# Patient Record
Sex: Female | Born: 1955 | Race: White | Hispanic: No | Smoking: Never smoker
Health system: Southern US, Community
[De-identification: ages and names within clinical notes are randomized; demographics above are authoritative.]

## PROBLEM LIST (undated history)

## (undated) HISTORY — PX: WRIST SURGERY: SHX841

---

## 2007-08-27 ENCOUNTER — Ambulatory Visit: Payer: Self-pay | Admitting: Unknown Physician Specialty

## 2007-08-28 ENCOUNTER — Ambulatory Visit: Payer: Self-pay | Admitting: Unknown Physician Specialty

## 2008-05-18 ENCOUNTER — Ambulatory Visit: Payer: Self-pay | Admitting: Family Medicine

## 2010-07-03 ENCOUNTER — Ambulatory Visit: Payer: Self-pay

## 2012-08-13 ENCOUNTER — Ambulatory Visit: Payer: Self-pay | Admitting: Family Medicine

## 2017-04-05 ENCOUNTER — Emergency Department: Payer: No Typology Code available for payment source

## 2017-04-05 ENCOUNTER — Emergency Department
Admission: EM | Admit: 2017-04-05 | Discharge: 2017-04-05 | Disposition: A | Discharge status: Home / Self Care | Payer: No Typology Code available for payment source | Attending: Emergency Medicine | Admitting: Emergency Medicine

## 2017-04-05 ENCOUNTER — Encounter: Payer: Self-pay | Admitting: Emergency Medicine

## 2017-04-05 DIAGNOSIS — Y9389 Activity, other specified: Secondary | ICD-10-CM | POA: Diagnosis not present

## 2017-04-05 DIAGNOSIS — S52571A Other intraarticular fracture of lower end of right radius, initial encounter for closed fracture: Secondary | ICD-10-CM | POA: Insufficient documentation

## 2017-04-05 DIAGNOSIS — Y999 Unspecified external cause status: Secondary | ICD-10-CM | POA: Diagnosis not present

## 2017-04-05 DIAGNOSIS — S52614A Nondisplaced fracture of right ulna styloid process, initial encounter for closed fracture: Secondary | ICD-10-CM | POA: Insufficient documentation

## 2017-04-05 DIAGNOSIS — Y92410 Unspecified street and highway as the place of occurrence of the external cause: Secondary | ICD-10-CM | POA: Insufficient documentation

## 2017-04-05 DIAGNOSIS — S6991XA Unspecified injury of right wrist, hand and finger(s), initial encounter: Secondary | ICD-10-CM | POA: Diagnosis present

## 2017-04-05 DIAGNOSIS — S62101A Fracture of unspecified carpal bone, right wrist, initial encounter for closed fracture: Secondary | ICD-10-CM

## 2017-04-05 MED ORDER — OXYCODONE-ACETAMINOPHEN 7.5-325 MG PO TABS: 1.0000 | ORAL_TABLET | ORAL | 0 refills | Status: DC | PRN

## 2017-04-05 MED ORDER — IBUPROFEN 600 MG PO TABS
600.0000 mg | ORAL_TABLET | Freq: Once | ORAL | Status: AC
  Administered 2017-04-05: 600 mg via ORAL
  Filled 2017-04-05: qty 1

## 2017-04-05 MED ORDER — OXYCODONE-ACETAMINOPHEN 5-325 MG PO TABS
1.0000 | ORAL_TABLET | Freq: Once | ORAL | Status: AC
  Administered 2017-04-05: 1 via ORAL
  Filled 2017-04-05: qty 1
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
## 2017-04-05 NOTE — Discharge Instructions (Signed)
Wear splint/sling until evaluation and treatment by orthopedic clinic. Did not have breakfast on the morning of the department.
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
## 2017-04-05 NOTE — ED Notes (Signed)
PA waiting to hear back from Ortho; delay explained to pt at this time.

## 2017-04-08 ENCOUNTER — Ambulatory Visit: Payer: No Typology Code available for payment source | Admitting: Anesthesiology

## 2017-04-08 ENCOUNTER — Ambulatory Visit
Admission: RE | Admit: 2017-04-08 | Discharge: 2017-04-08 | Disposition: A | Discharge status: Home / Self Care | Payer: No Typology Code available for payment source | Source: Ambulatory Visit | Attending: Orthopedic Surgery | Admitting: Orthopedic Surgery

## 2017-04-08 ENCOUNTER — Encounter: Payer: Self-pay | Admitting: *Deleted

## 2017-04-08 ENCOUNTER — Ambulatory Visit: Payer: No Typology Code available for payment source

## 2017-04-08 ENCOUNTER — Encounter
Admission: RE | Disposition: A | Discharge status: Home / Self Care | Payer: Self-pay | Source: Ambulatory Visit | Attending: Orthopedic Surgery

## 2017-04-08 DIAGNOSIS — Y92410 Unspecified street and highway as the place of occurrence of the external cause: Secondary | ICD-10-CM | POA: Diagnosis not present

## 2017-04-08 DIAGNOSIS — S52571A Other intraarticular fracture of lower end of right radius, initial encounter for closed fracture: Secondary | ICD-10-CM | POA: Diagnosis present

## 2017-04-08 DIAGNOSIS — Y939 Activity, unspecified: Secondary | ICD-10-CM | POA: Insufficient documentation

## 2017-04-08 DIAGNOSIS — Z9889 Other specified postprocedural states: Secondary | ICD-10-CM

## 2017-04-08 DIAGNOSIS — Z8781 Personal history of (healed) traumatic fracture: Secondary | ICD-10-CM

## 2017-04-08 HISTORY — PX: OPEN REDUCTION INTERNAL FIXATION (ORIF) DISTAL RADIAL FRACTURE: SHX5989

## 2017-04-08 SURGERY — OPEN REDUCTION INTERNAL FIXATION (ORIF) DISTAL RADIUS FRACTURE
Anesthesia: General | Site: Wrist | Laterality: Right | Wound class: Clean

## 2017-04-08 MED ORDER — ACETAMINOPHEN 10 MG/ML IV SOLN
INTRAVENOUS | Status: AC
  Filled 2017-04-08: qty 100

## 2017-04-08 MED ORDER — CEFAZOLIN SODIUM-DEXTROSE 2-4 GM/100ML-% IV SOLN
INTRAVENOUS | Status: AC
  Filled 2017-04-08: qty 100

## 2017-04-08 MED ORDER — LACTATED RINGERS IV SOLN
INTRAVENOUS | Status: DC
  Administered 2017-04-08 (×2): via INTRAVENOUS

## 2017-04-08 MED ORDER — NEOMYCIN-POLYMYXIN B GU 40-200000 IR SOLN
Status: DC | PRN
  Administered 2017-04-08: 2 mL

## 2017-04-08 MED ORDER — PROMETHAZINE HCL 25 MG/ML IJ SOLN: 6.2500 mg | INTRAMUSCULAR | Status: DC | PRN

## 2017-04-08 MED ORDER — SUGAMMADEX SODIUM 200 MG/2ML IV SOLN
INTRAVENOUS | Status: DC | PRN
  Administered 2017-04-08: 150 mg via INTRAVENOUS

## 2017-04-08 MED ORDER — ROCURONIUM BROMIDE 100 MG/10ML IV SOLN
INTRAVENOUS | Status: DC | PRN
Start: 2017-04-08 — End: 2017-04-08
  Administered 2017-04-08: 50 mg via INTRAVENOUS

## 2017-04-08 MED ORDER — FENTANYL CITRATE (PF) 250 MCG/5ML IJ SOLN
INTRAMUSCULAR | Status: AC
  Filled 2017-04-08: qty 5

## 2017-04-08 MED ORDER — ONDANSETRON HCL 4 MG/2ML IJ SOLN
INTRAMUSCULAR | Status: DC | PRN
Start: 2017-04-08 — End: 2017-04-08
  Administered 2017-04-08: 4 mg via INTRAVENOUS

## 2017-04-08 MED ORDER — ROCURONIUM BROMIDE 50 MG/5ML IV SOLN
INTRAVENOUS | Status: AC
  Filled 2017-04-08: qty 1

## 2017-04-08 MED ORDER — CEFAZOLIN SODIUM-DEXTROSE 2-4 GM/100ML-% IV SOLN
2.0000 g | Freq: Once | INTRAVENOUS | Status: AC
  Administered 2017-04-08: 2 g via INTRAVENOUS

## 2017-04-08 MED ORDER — LIDOCAINE HCL (CARDIAC) 20 MG/ML IV SOLN
INTRAVENOUS | Status: DC | PRN
  Administered 2017-04-08: 40 mg via INTRAVENOUS

## 2017-04-08 MED ORDER — OXYCODONE HCL 5 MG PO TABS: 5.0000 mg | ORAL_TABLET | Freq: Once | ORAL | Status: DC | PRN

## 2017-04-08 MED ORDER — ACETAMINOPHEN 10 MG/ML IV SOLN
INTRAVENOUS | Status: DC | PRN
  Administered 2017-04-08: 1000 mg via INTRAVENOUS

## 2017-04-08 MED ORDER — FENTANYL CITRATE (PF) 100 MCG/2ML IJ SOLN
INTRAMUSCULAR | Status: AC
  Filled 2017-04-08: qty 2

## 2017-04-08 MED ORDER — FENTANYL CITRATE (PF) 100 MCG/2ML IJ SOLN
INTRAMUSCULAR | Status: DC | PRN
  Administered 2017-04-08: 100 ug via INTRAVENOUS
  Administered 2017-04-08: 50 ug via INTRAVENOUS
  Administered 2017-04-08: 100 ug via INTRAVENOUS

## 2017-04-08 MED ORDER — PROPOFOL 10 MG/ML IV BOLUS
INTRAVENOUS | Status: AC
  Filled 2017-04-08: qty 20

## 2017-04-08 MED ORDER — MIDAZOLAM HCL 2 MG/2ML IJ SOLN
INTRAMUSCULAR | Status: DC | PRN
  Administered 2017-04-08: 2 mg via INTRAVENOUS

## 2017-04-08 MED ORDER — OXYCODONE HCL 5 MG/5ML PO SOLN: 5.0000 mg | Freq: Once | ORAL | Status: DC | PRN

## 2017-04-08 MED ORDER — DEXAMETHASONE SODIUM PHOSPHATE 10 MG/ML IJ SOLN
INTRAMUSCULAR | Status: DC | PRN
  Administered 2017-04-08: 8 mg via INTRAVENOUS

## 2017-04-08 MED ORDER — MEPERIDINE HCL 50 MG/ML IJ SOLN
6.2500 mg | INTRAMUSCULAR | Status: DC | PRN
Start: 2017-04-08 — End: 2017-04-08

## 2017-04-08 MED ORDER — FENTANYL CITRATE (PF) 100 MCG/2ML IJ SOLN
25.0000 ug | INTRAMUSCULAR | Status: AC | PRN
  Administered 2017-04-08 (×6): 25 ug via INTRAVENOUS

## 2017-04-08 MED ORDER — HYDROCODONE-ACETAMINOPHEN 5-325 MG PO TABS: 1.0000 | ORAL_TABLET | Freq: Four times a day (QID) | ORAL | 0 refills | Status: AC | PRN

## 2017-04-08 MED ORDER — PROPOFOL 10 MG/ML IV BOLUS
INTRAVENOUS | Status: DC | PRN
  Administered 2017-04-08: 130 mg via INTRAVENOUS

## 2017-04-08 MED ORDER — LIDOCAINE HCL (PF) 2 % IJ SOLN
INTRAMUSCULAR | Status: AC
  Filled 2017-04-08: qty 2

## 2017-04-08 MED ORDER — MIDAZOLAM HCL 2 MG/2ML IJ SOLN
INTRAMUSCULAR | Status: AC
  Filled 2017-04-08: qty 2

## 2017-04-08 SURGICAL SUPPLY — 38 items
BANDAGE ACE 4X5 VEL STRL LF (GAUZE/BANDAGES/DRESSINGS) ×3 IMPLANT
BIT DRILL 2 FAST STEP (BIT) ×3 IMPLANT
BIT DRILL 2.5X4 QC (BIT) ×3 IMPLANT
CANISTER SUCT 1200ML W/VALVE (MISCELLANEOUS) ×3 IMPLANT
CHLORAPREP W/TINT 26ML (MISCELLANEOUS) ×3 IMPLANT
CUFF TOURN 18 STER (MISCELLANEOUS) ×3 IMPLANT
DRAPE FLUOR MINI C-ARM 54X84 (DRAPES) ×3 IMPLANT
ELECT REM PT RETURN 9FT ADLT (ELECTROSURGICAL) ×3
ELECTRODE REM PT RTRN 9FT ADLT (ELECTROSURGICAL) ×1 IMPLANT
GAUZE PETRO XEROFOAM 1X8 (MISCELLANEOUS) ×6 IMPLANT
GAUZE SPONGE 4X4 12PLY STRL (GAUZE/BANDAGES/DRESSINGS) ×3 IMPLANT
GAUZE XEROFORM 4X4 STRL (GAUZE/BANDAGES/DRESSINGS) ×3 IMPLANT
GLOVE SURG SYN 9.0  PF PI (GLOVE) ×2
GLOVE SURG SYN 9.0 PF PI (GLOVE) ×1 IMPLANT
GOWN SRG 2XL LVL 4 RGLN SLV (GOWNS) ×1 IMPLANT
GOWN STRL NON-REIN 2XL LVL4 (GOWNS) ×2
GOWN STRL REUS W/ TWL LRG LVL3 (GOWN DISPOSABLE) ×1 IMPLANT
GOWN STRL REUS W/TWL LRG LVL3 (GOWN DISPOSABLE) ×2
K-WIRE 1.6 (WIRE) ×2
K-WIRE FX5X1.6XNS BN SS (WIRE) ×1
KIT RM TURNOVER STRD PROC AR (KITS) ×3 IMPLANT
KWIRE FX5X1.6XNS BN SS (WIRE) ×1 IMPLANT
NEEDLE FILTER BLUNT 18X 1/2SAF (NEEDLE) ×2
NEEDLE FILTER BLUNT 18X1 1/2 (NEEDLE) ×1 IMPLANT
NS IRRIG 500ML POUR BTL (IV SOLUTION) ×3 IMPLANT
PACK EXTREMITY ARMC (MISCELLANEOUS) ×3 IMPLANT
PAD CAST CTTN 4X4 STRL (SOFTGOODS) ×1 IMPLANT
PADDING CAST COTTON 4X4 STRL (SOFTGOODS) ×2
PEG SUBCHONDRAL SMOOTH 2.0X14 (Peg) ×9 IMPLANT
PEG SUBCHONDRAL SMOOTH 2.0X16 (Peg) ×6 IMPLANT
PLATE SHORT 21.6X48.9 NRRW RT (Plate) ×3 IMPLANT
SCREW CORT 3.5X10 LNG (Screw) ×9 IMPLANT
SPLINT CAST 1 STEP 3X12 (MISCELLANEOUS) ×3 IMPLANT
SUT ETHILON 4-0 (SUTURE) ×2
SUT ETHILON 4-0 FS2 18XMFL BLK (SUTURE) ×1
SUT VICRYL 3-0 27IN (SUTURE) ×3 IMPLANT
SUTURE ETHLN 4-0 FS2 18XMF BLK (SUTURE) ×1 IMPLANT
SYR 3ML LL SCALE MARK (SYRINGE) ×3 IMPLANT

## 2017-04-08 NOTE — Discharge Instructions (Addendum)
AMBULATORY SURGERY  DISCHARGE INSTRUCTIONS   1) The drugs that you were given will stay in your system until tomorrow so for the next 24 hours you should not:  A) Drive an automobile B) Make any legal decisions C) Drink any alcoholic beverage   2) You may resume regular meals tomorrow.  Today it is better to start with liquids and gradually work up to solid foods.  You may eat anything you prefer, but it is better to start with liquids, then soup and crackers, and gradually work up to solid foods.   3) Please notify your doctor immediately if you have any unusual bleeding, trouble breathing, redness and pain at the surgery site, drainage, fever, or pain not relieved by medication.    4) Additional Instructions:        Please contact your physician with any problems or Same Day Surgery at [phone], Monday through Friday 6 am to 4 pm, or [place] at [facility] Main number at [phone].Keep hand elevated. Work fingers is much as possible. Loosen Ace wrap if fingers swell. Pain medicine as directed

## 2017-04-08 NOTE — Anesthesia Procedure Notes (Signed)
Procedure Name: Intubation Date/Time: 04/08/2017 3:23 PM Performed by: Allean Found Pre-anesthesia Checklist: Patient identified, Emergency Drugs available, Suction available, Patient being monitored and Timeout performed Patient Re-evaluated:Patient Re-evaluated prior to inductionOxygen Delivery Method: Circle system utilized Preoxygenation: Pre-oxygenation with 100% oxygen Intubation Type: IV induction Ventilation: Mask ventilation without difficulty Laryngoscope Size: Mac and 3 Grade View: Grade I Tube type: Oral Tube size: 7.0 mm Number of attempts: 1 Airway Equipment and Method: Stylet Placement Confirmation: ETT inserted through vocal cords under direct vision,  positive ETCO2 and breath sounds checked- equal and bilateral Secured at: 21 cm Tube secured with: Tape Dental Injury: Teeth and Oropharynx as per pre-operative assessment

## 2017-04-08 NOTE — H&P (Signed)
Reviewed paper H+P, will be scanned into chart. No changes noted.  

## 2017-04-08 NOTE — Anesthesia Post-op Follow-up Note (Cosign Needed)
Anesthesia QCDR form completed.        
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
## 2017-04-08 NOTE — Transfer of Care (Signed)
Immediate Anesthesia Transfer of Care Note  Patient: Veronica Austin  Procedure(s) Performed: Procedure(s): OPEN REDUCTION INTERNAL FIXATION (ORIF) DISTAL RADIAL FRACTURE (Right)  Patient Location: PACU  Anesthesia Type:General  Level of Consciousness: awake, alert  and oriented  Airway & Oxygen Therapy: Patient Spontanous Breathing and Patient connected to face mask oxygen  Post-op Assessment: Report given to RN and Post -op Vital signs reviewed and stable  Post vital signs: Reviewed and stable  Last Vitals:  Vitals:   04/08/17 1342  BP: 133/77  Pulse: 78  Resp: 18  Temp: 37.1 C    Last Pain:  Vitals:   04/08/17 1342  TempSrc: Oral  PainSc: 6          Complications: No apparent anesthesia complications
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
## 2017-04-08 NOTE — Op Note (Signed)
04/08/2017  4:09 PM  PATIENT:  Veronica Austin  61 y.o. female  PRE-OPERATIVE DIAGNOSIS:  fracture right distal radius comminuted intra-articular  POST-OPERATIVE DIAGNOSIS:  fracture right distal radius comminuted intra-articular  PROCEDURE:  Procedure(s): OPEN REDUCTION INTERNAL FIXATION (ORIF) DISTAL RADIAL FRACTURE (Right)  SURGEON: Leitha SchullerMichael J Jakaylah Schlafer, MD  ASSISTANTS: None  ANESTHESIA:   general  EBL:  Total I/O In: 300 [I.V.:300] Out: 10 [Blood:10]  BLOOD ADMINISTERED:none  DRAINS: none   LOCAL MEDICATIONS USED:  NONE  SPECIMEN:  No Specimen  DISPOSITION OF SPECIMEN:  N/A  COUNTS:  YES  TOURNIQUET:   Total Tourniquet Time Documented: Upper Arm (Right) - 17 minutes Total: Upper Arm (Right) - 17 minutes   IMPLANTS: [employer] short narrow DVR plate with multiple pegs and screws  DICTATION: .Dragon Dictation patient was brought to the operating room and after adequate general anesthesia was obtained, the right arm was prepped and draped in sterile fashion with a tourniquet applied to the upper arm. After patient identification and timeout procedures were completed, tourniquet was raised. A volar approach is made centered over the FCR tendon. The tendon sheath was incised and the tendon retracted radially. The deep fascia was incised and the substantial fascia and muscle was retracted radially. The quadratus had been torn from the bone and was elevated easily. With fingertrap traction applied and 10 pounds of traction off the end of the table length of been restored with direct pressure anatomic alignment was obtained. A volar plate was then placed and a pin placed with a K wire driver to hold it in position and when it was the appropriate position 3 cortical screws were placed proximally followed by smooth pegs drilling measuring and placing the smooth pegs there was no intra-articular penetration and stable fixation. After tourniquet was let down under fluoroscopy  without the traction applied anatomic reduction was stable the wound was irrigated and closed with 2 3-0 Vicryl substantially and 4-0 nylon Xeroform 4 x 4 web roll volar splint and Ace wrap applied  PLAN OF CARE: Discharge to home after PACU  PATIENT DISPOSITION:  PACU - hemodynamically stable.

## 2017-04-09 ENCOUNTER — Encounter: Payer: Self-pay | Admitting: Orthopedic Surgery

## 2017-04-10 NOTE — Anesthesia Postprocedure Evaluation (Signed)
Anesthesia Post Note  Patient: Athanasia A Jezek  Procedure(s) Performed: Procedure(s) (LRB): OPEN REDUCTION INTERNAL FIXATION (ORIF) DISTAL RADIAL FRACTURE (Right)  Patient location during evaluation: PACU Anesthesia Type: General Level of consciousness: awake and alert and oriented Pain management: pain level controlled Vital Signs Assessment: post-procedure vital signs reviewed and stable Respiratory status: spontaneous breathing, nonlabored ventilation and respiratory function stable Cardiovascular status: blood pressure returned to baseline and stable Postop Assessment: no signs of nausea or vomiting Anesthetic complications: no     Last Vitals:  Vitals:   04/08/17 1700 04/08/17 1747  BP: (!) 154/80 136/72  Pulse: 76 86  Resp: 16 16  Temp: 36.7 C     Last Pain:  Vitals:   04/09/17 0916  TempSrc:   PainSc: 0-No pain                 Kenzie Thoreson
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
## 2018-05-31 IMAGING — DX DG WRIST 2V*R*
2 series · 2 of 2 positions shown · non-contrast
Comparison: 04/05/2017

CLINICAL DATA: Open-reduction internal-fixation of the right
radius.

EXAM:
RIGHT WRIST - 2 VIEW

[wrist ap]
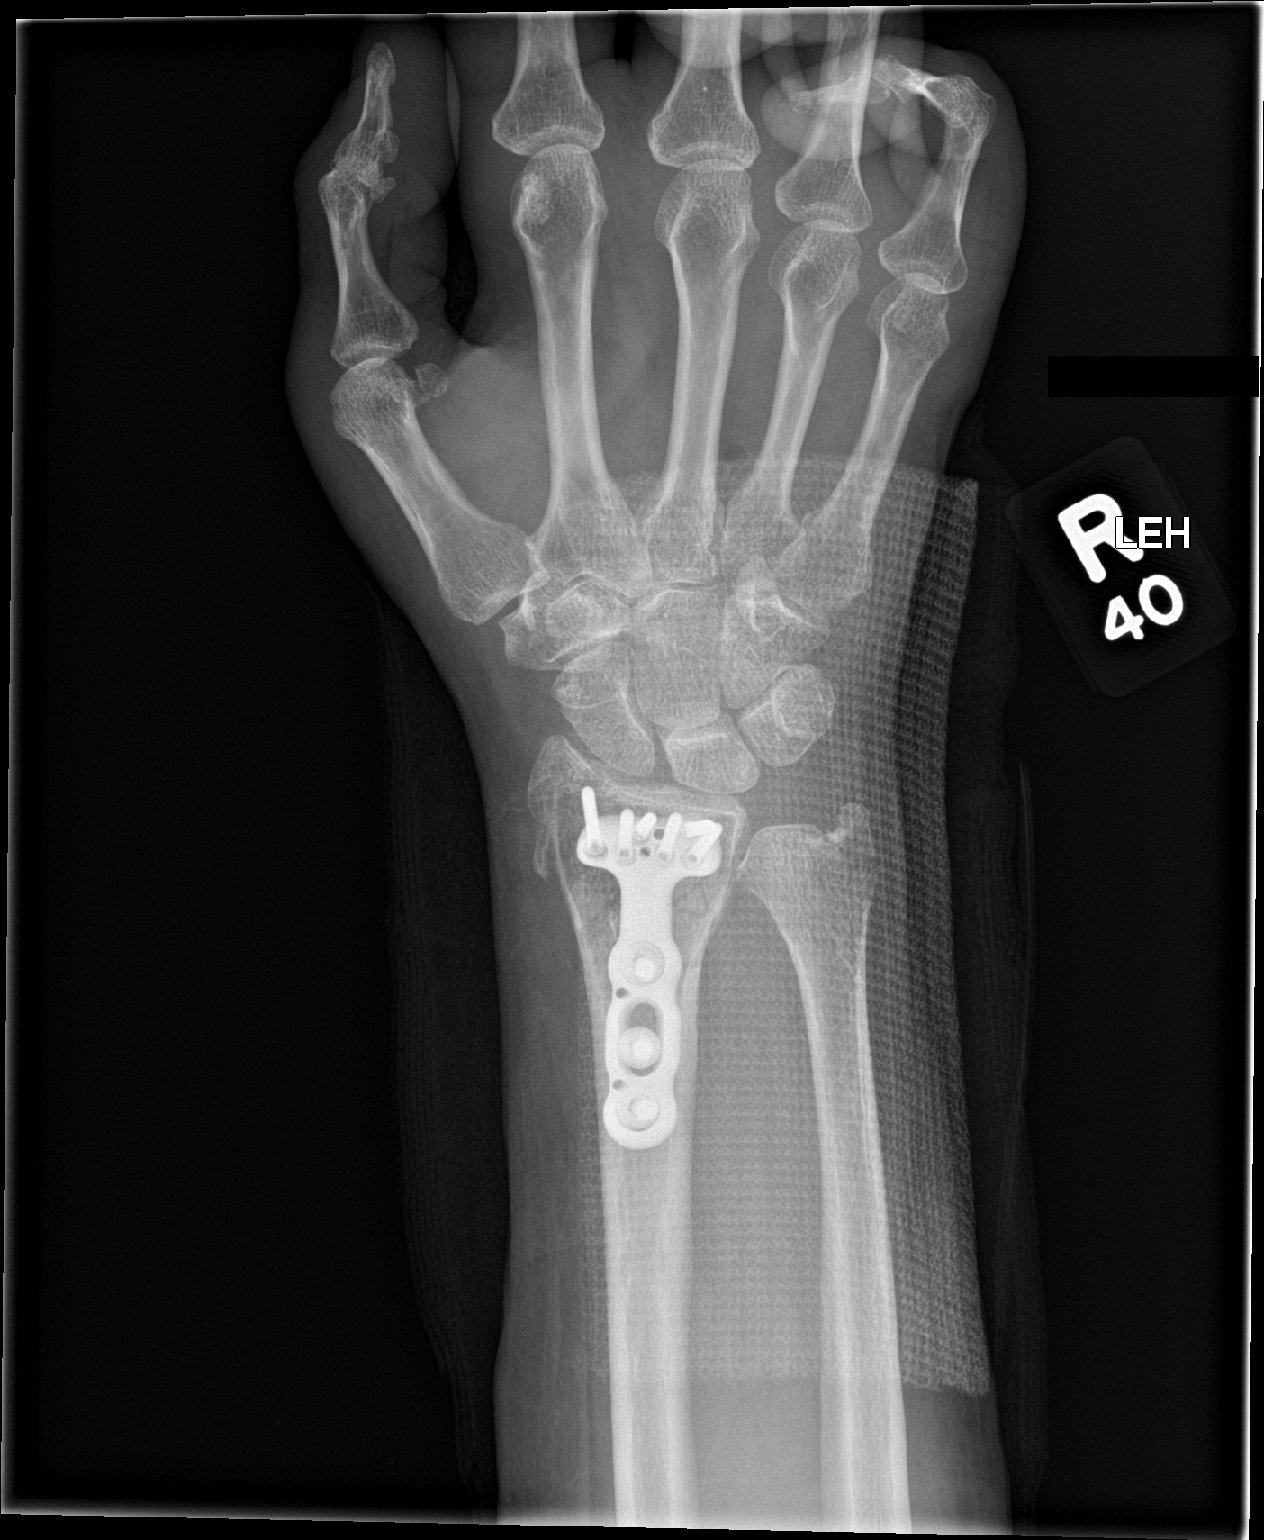

[wrist lat]
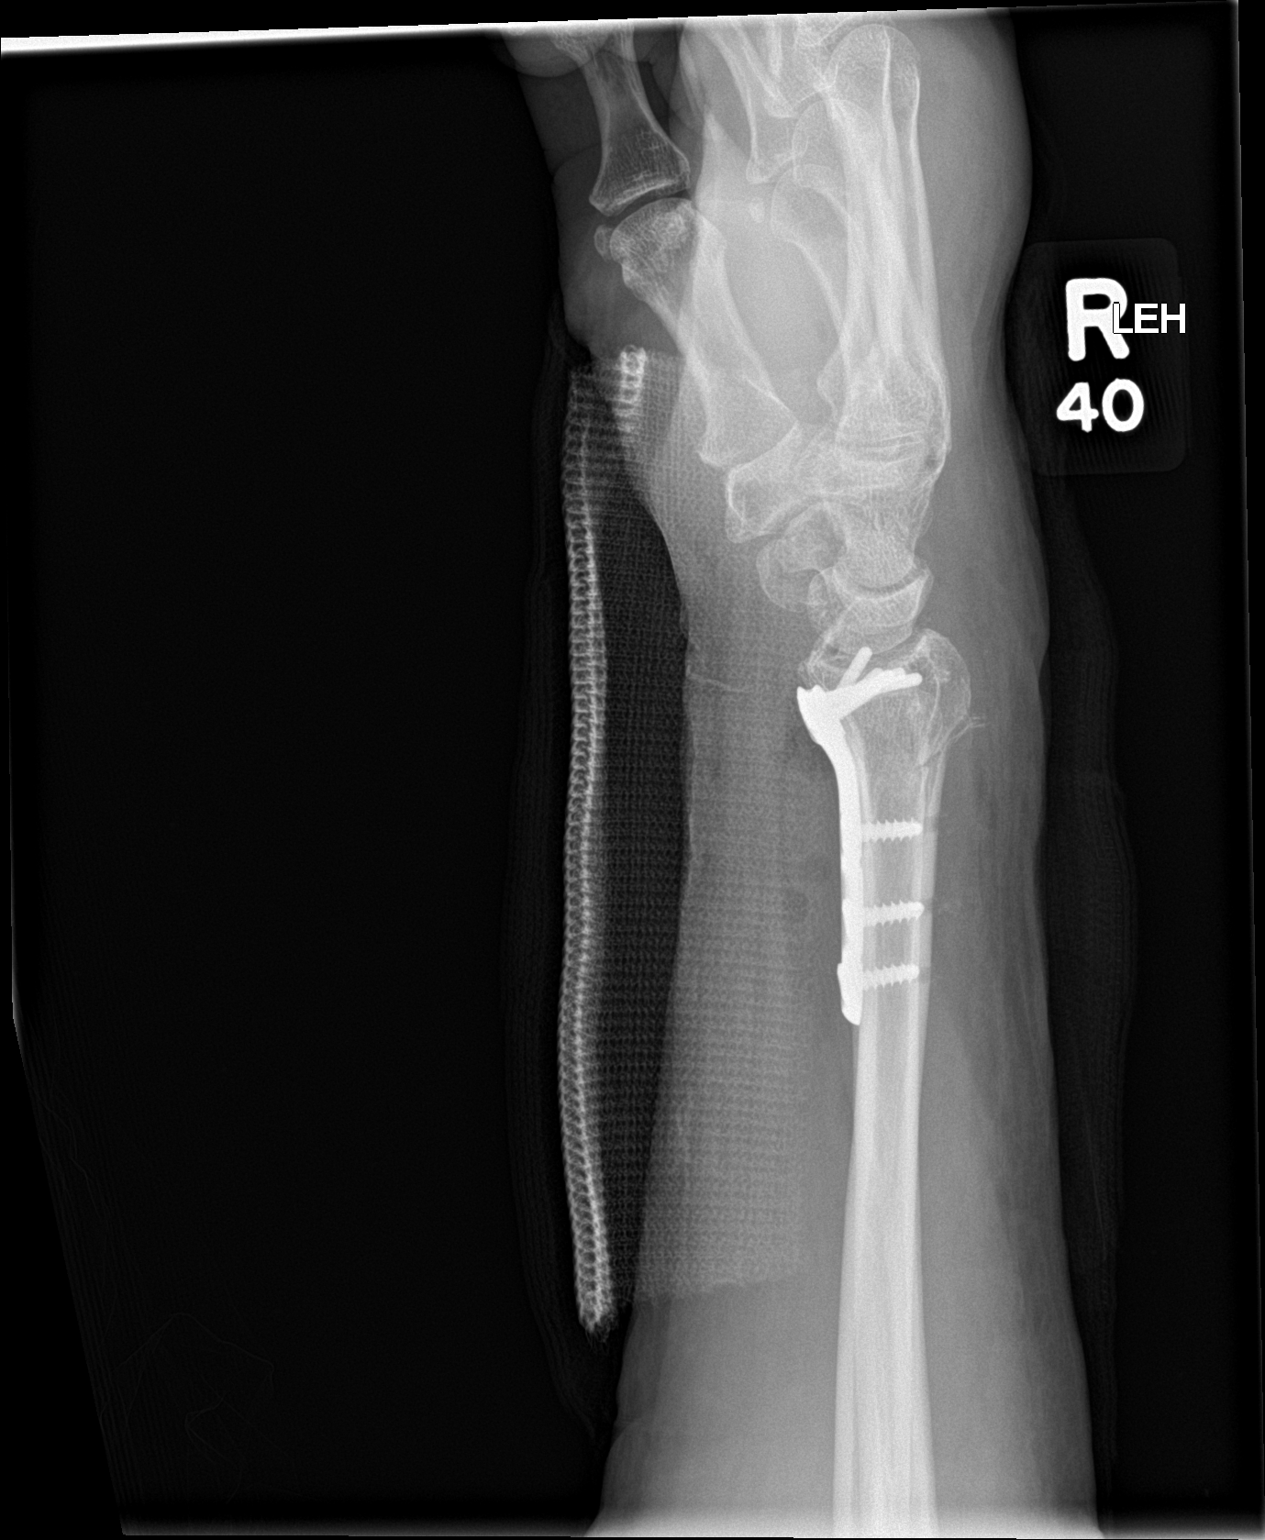

[2 of 2 positions shown; findings below may reference images not displayed]

FINDINGS: There has been a sideplate and screw fixation of comminuted
intra-articular distal right radial fracture. The alignment is near
anatomic. Accompanied ulnar styloid process fracture is again seen.
Expected soft tissue swelling is seen.
IMPRESSION: Sideplate and screw fixation of distal radial fracture with
improvement in alignment.

## 2019-07-02 ENCOUNTER — Emergency Department
Admission: EM | Admit: 2019-07-02 | Discharge: 2019-07-02 | Disposition: A | Discharge status: Home / Self Care | Payer: Self-pay | Attending: Student in an Organized Health Care Education/Training Program | Admitting: Student in an Organized Health Care Education/Training Program

## 2019-07-02 ENCOUNTER — Other Ambulatory Visit: Payer: Self-pay

## 2019-07-02 ENCOUNTER — Encounter: Payer: Self-pay | Admitting: Emergency Medicine

## 2019-07-02 DIAGNOSIS — T782XXA Anaphylactic shock, unspecified, initial encounter: Secondary | ICD-10-CM | POA: Insufficient documentation

## 2019-07-02 MED ORDER — EPINEPHRINE 0.3 MG/0.3ML IJ SOAJ
0.3000 mg | Freq: Once | INTRAMUSCULAR | Status: AC
  Administered 2019-07-02: 12:00:00 0.3 mg via INTRAMUSCULAR

## 2019-07-02 MED ORDER — EPINEPHRINE 0.3 MG/0.3ML IJ SOAJ
INTRAMUSCULAR | Status: AC
  Administered 2019-07-02: 0.3 mg via INTRAMUSCULAR
  Filled 2019-07-02: qty 0.3

## 2019-07-02 MED ORDER — ACETAMINOPHEN 500 MG PO TABS
1000.0000 mg | ORAL_TABLET | Freq: Once | ORAL | Status: AC
  Administered 2019-07-02: 16:00:00 1000 mg via ORAL
  Filled 2019-07-02: qty 2

## 2019-07-02 MED ORDER — DIPHENHYDRAMINE HCL 50 MG/ML IJ SOLN
12.5000 mg | Freq: Once | INTRAMUSCULAR | Status: AC
  Administered 2019-07-02: 12:00:00 12.5 mg via INTRAVENOUS
  Filled 2019-07-02: qty 1

## 2019-07-02 MED ORDER — METHYLPREDNISOLONE SODIUM SUCC 125 MG IJ SOLR
125.0000 mg | Freq: Once | INTRAMUSCULAR | Status: AC
  Administered 2019-07-02: 125 mg via INTRAVENOUS
  Filled 2019-07-02: qty 2

## 2019-07-02 MED ORDER — FAMOTIDINE IN NACL 20-0.9 MG/50ML-% IV SOLN
20.0000 mg | Freq: Once | INTRAVENOUS | Status: AC
  Administered 2019-07-02: 20 mg via INTRAVENOUS
  Filled 2019-07-02: qty 50

## 2019-07-02 MED ORDER — PREDNISONE 20 MG PO TABS: 40.0000 mg | ORAL_TABLET | Freq: Every day | ORAL | 0 refills | Status: AC

## 2019-07-02 MED ORDER — EPINEPHRINE 0.3 MG/0.3ML IJ SOAJ: 0.3000 mg | INTRAMUSCULAR | 1 refills | Status: AC | PRN

## 2019-07-02 NOTE — ED Notes (Signed)
Pt got stung by a hornet on her L eyelid this am 0915. Pt stated the swelling started around the left eye and progressively travelled to the R eye. Swelling noted around L and R eye. Denies visual disturbances, denies any hives or swelling anywhere else. No trouble swallowing or breathing.

## 2019-07-02 NOTE — ED Provider Notes (Signed)
[facility] Emergency Department Provider Note    First MD Initiated Contact with Patient 07/02/19 1158     (approximate)  I have reviewed the triage vital signs and the nursing notes.   HISTORY  Chief Complaint Allergic Reaction    HPI Veronica Austin is a 63 y.o. female presents the ER for swelling of the face after she was stung by a wasp this morning.  Was stung just over the left eye.  Denied any chest pain or shortness of breath.  No trouble swallowing.  Feels some tingling in her throat.  Is never had allergic reaction before.  Did take Benadryl prior to arrival.  Eyes any other injuries.    History reviewed. No pertinent past medical history. No family history on file. Past Surgical History:  Procedure Laterality Date  . OPEN REDUCTION INTERNAL FIXATION (ORIF) DISTAL RADIAL FRACTURE Right 04/08/2017   Procedure: OPEN REDUCTION INTERNAL FIXATION (ORIF) DISTAL RADIAL FRACTURE;  Surgeon: Hessie Knows, MD;  Location: [facility] ORS;  Service: Orthopedics;  Laterality: Right;  . WRIST SURGERY     There are no active problems to display for this patient.     Prior to Admission medications   Medication Sig Start Date End Date Taking? Authorizing Provider  EPINEPHrine 0.3 mg/0.3 mL IJ SOAJ injection Inject 0.3 mLs (0.3 mg total) into the muscle as needed for anaphylaxis. 07/02/19   Merlyn Lot, MD  HYDROcodone-acetaminophen (NORCO) 5-325 MG tablet Take 1 tablet by mouth every 6 (six) hours as needed. Patient not taking: Reported on 07/02/2019 04/08/17   Hessie Knows, MD  predniSONE (DELTASONE) 20 MG tablet Take 2 tablets (40 mg total) by mouth daily for 5 days. 07/02/19 07/07/19  Merlyn Lot, MD    Allergies Patient has no known allergies.    Social History Social History   Tobacco Use  . Smoking status: Never Smoker  . Smokeless tobacco: Never Used  Substance Use Topics  . Alcohol use: Yes  . Drug use: No    Review of Systems  Patient denies headaches, rhinorrhea, blurry vision, numbness, shortness of breath, chest pain, edema, cough, abdominal pain, nausea, vomiting, diarrhea, dysuria, fevers, rashes or hallucinations unless otherwise stated above in HPI. ____________________________________________   PHYSICAL EXAM:  VITAL SIGNS: Vitals:   07/02/19 1430 07/02/19 1500  BP: 129/78 126/74  Pulse: 89 88  Resp: 17 (!) 21  Temp:    SpO2: 92% 94%    Constitutional: Alert and oriented.  Eyes: Bilateral left greater than right periorbital swelling and edema consistent with allergic reaction status post possible ventilation.  No proptosis.  Extraocular motions intact Head: Atraumatic. Nose: No congestion/rhinnorhea. Mouth/Throat: Mucous membranes are moist.  No uvular edema Neck: No stridor. Painless ROM.  Cardiovascular: Normal rate, regular rhythm. Grossly normal heart sounds.  Good peripheral circulation. Respiratory: Normal respiratory effort.  No retractions. Lungs CTAB. Gastrointestinal: Soft and nontender. No distention. No abdominal bruits. No CVA tenderness. Genitourinary: deferred Musculoskeletal: No lower extremity tenderness nor edema.  No joint effusions. Neurologic:  Normal speech and language. No gross focal neurologic deficits are appreciated. No facial droop Skin:  Skin is warm, dry and intact. No rash noted. Psychiatric: Mood and affect are normal. Speech and behavior are normal.  ____________________________________________   LABS (all labs ordered are listed, but only abnormal results are displayed)  No results found for this or any previous visit (from the past 24 hour(s)). ________________________________________________   PROCEDURES  Procedure(s) performed:  Procedures    Critical Care  performed: no ____________________________________________   INITIAL IMPRESSION / ASSESSMENT AND PLAN / ED COURSE  Pertinent labs & imaging results that were available during my care of the  patient were reviewed by me and considered in my medical decision making (see chart for details).   DDX: Anaphylaxis, urticaria, anaphylactoid, angioedema  Veronica Austin is a [age] y.oHyacinth Meeker. who presents to the ED with bee sting and anaphylaxis secondary to envenomation.  She is protecting her airway.  Symptoms improved after epinephrine.  Will give Benadryl Pepcid as well as steroids and observe.  Clinical Course as of Jul 02 1539  Thu Jul 02, 2019  1359 Patient reassessed.  Swelling has significantly decreased.  We will continue to observe.   [PR]    Clinical Course User Index [PR] Willy Eddyobinson, Lelania Bia, MD   Patient continues to improve clinically.  Will be observed for total 4hours.  Patient be signed out to oncoming physician.  Anticipate discharge home.   The patient was evaluated in Emergency Department today for the symptoms described in the history of present illness. He/she was evaluated in the context of the global COVID-19 pandemic, which necessitated consideration that the patient might be at risk for infection with the SARS-CoV-2 virus that causes COVID-19. Institutional protocols and algorithms that pertain to the evaluation of patients at risk for COVID-19 are in a state of rapid change based on information released by regulatory bodies including the CDC and federal and state organizations. These policies and algorithms were followed during the patient's care in the ED.  As part of my medical decision making, I reviewed the following data within the electronic MEDICAL RECORD NUMBER Nursing notes reviewed and incorporated, Labs reviewed, notes from prior ED visits and [place] Controlled Substance Database   ____________________________________________   FINAL CLINICAL IMPRESSION(S) / ED DIAGNOSES  Final diagnoses:  Anaphylaxis, initial encounter      NEW MEDICATIONS STARTED DURING THIS VISIT:  New Prescriptions   EPINEPHRINE 0.3 MG/0.3 ML IJ SOAJ INJECTION    Inject 0.3 mLs (0.3 mg  total) into the muscle as needed for anaphylaxis.   PREDNISONE (DELTASONE) 20 MG TABLET    Take 2 tablets (40 mg total) by mouth daily for 5 days.     Note:  This document was prepared using Dragon voice recognition software and may include unintentional dictation errors.    Willy Eddyobinson, Loney Domingo, MD 07/02/19 1540

## 2019-07-02 NOTE — ED Triage Notes (Signed)
Pt referred to ED by NP, arrives via POV with c/o facial swelling. Pt states was stung by possibly a hornet at approx 0915 this morning then began having facial swelling. Pt presents to ED with significant swelling noted to bilateral eyes and to her cheeks. Pt able to speak in complete sentences without difficulty, pt denies SOB, states her face feels numb at this time.
